# Patient Record
Sex: Female | Born: 1965 | Race: White | Hispanic: No | Marital: Married | Smoking: Never smoker
Health system: Southern US, Community
[De-identification: ages and names within clinical notes are randomized; demographics above are authoritative.]

## PROBLEM LIST (undated history)

## (undated) DIAGNOSIS — F32A Depression, unspecified: Secondary | ICD-10-CM

## (undated) DIAGNOSIS — F329 Major depressive disorder, single episode, unspecified: Secondary | ICD-10-CM

---

## 2000-04-20 ENCOUNTER — Encounter: Payer: Self-pay | Admitting: Family Medicine

## 2000-04-20 ENCOUNTER — Encounter: Admission: RE | Admit: 2000-04-20 | Discharge: 2000-04-20 | Payer: Self-pay | Admitting: Family Medicine

## 2000-07-09 ENCOUNTER — Other Ambulatory Visit: Admission: RE | Admit: 2000-07-09 | Discharge: 2000-07-09 | Payer: Self-pay | Admitting: Obstetrics and Gynecology

## 2000-11-23 ENCOUNTER — Other Ambulatory Visit: Admission: RE | Admit: 2000-11-23 | Discharge: 2000-11-23 | Payer: Self-pay | Admitting: Obstetrics and Gynecology

## 2001-03-20 ENCOUNTER — Encounter: Payer: Self-pay | Admitting: Obstetrics and Gynecology

## 2001-03-20 ENCOUNTER — Encounter: Admission: RE | Admit: 2001-03-20 | Discharge: 2001-03-20 | Payer: Self-pay | Admitting: Obstetrics and Gynecology

## 2001-06-28 ENCOUNTER — Other Ambulatory Visit: Admission: RE | Admit: 2001-06-28 | Discharge: 2001-06-28 | Payer: Self-pay | Admitting: Obstetrics and Gynecology

## 2002-02-14 ENCOUNTER — Other Ambulatory Visit: Admission: RE | Admit: 2002-02-14 | Discharge: 2002-02-14 | Payer: Self-pay | Admitting: Obstetrics and Gynecology

## 2002-05-09 ENCOUNTER — Ambulatory Visit (HOSPITAL_COMMUNITY): Admission: RE | Admit: 2002-05-09 | Discharge: 2002-05-09 | Payer: Self-pay | Admitting: Obstetrics and Gynecology

## 2002-10-28 ENCOUNTER — Other Ambulatory Visit: Admission: RE | Admit: 2002-10-28 | Discharge: 2002-10-28 | Payer: Self-pay | Admitting: Obstetrics and Gynecology

## 2006-03-01 ENCOUNTER — Encounter: Admission: RE | Admit: 2006-03-01 | Discharge: 2006-03-01 | Payer: Self-pay | Admitting: Obstetrics and Gynecology

## 2010-09-21 ENCOUNTER — Other Ambulatory Visit: Payer: Self-pay | Admitting: Obstetrics and Gynecology

## 2011-09-04 ENCOUNTER — Emergency Department (HOSPITAL_COMMUNITY)
Admission: EM | Admit: 2011-09-04 | Discharge: 2011-09-04 | Disposition: A | Discharge status: Home / Self Care | Payer: BC Managed Care – PPO | Attending: Emergency Medicine | Admitting: Emergency Medicine

## 2011-09-04 ENCOUNTER — Encounter (HOSPITAL_COMMUNITY): Payer: Self-pay | Admitting: Emergency Medicine

## 2011-09-04 DIAGNOSIS — E162 Hypoglycemia, unspecified: Secondary | ICD-10-CM

## 2011-09-04 DIAGNOSIS — F329 Major depressive disorder, single episode, unspecified: Secondary | ICD-10-CM | POA: Insufficient documentation

## 2011-09-04 DIAGNOSIS — E876 Hypokalemia: Secondary | ICD-10-CM | POA: Insufficient documentation

## 2011-09-04 DIAGNOSIS — R7309 Other abnormal glucose: Secondary | ICD-10-CM | POA: Insufficient documentation

## 2011-09-04 DIAGNOSIS — F10929 Alcohol use, unspecified with intoxication, unspecified: Secondary | ICD-10-CM

## 2011-09-04 DIAGNOSIS — G7119 Other specified myotonic disorders: Secondary | ICD-10-CM | POA: Insufficient documentation

## 2011-09-04 DIAGNOSIS — F1010 Alcohol abuse, uncomplicated: Secondary | ICD-10-CM | POA: Insufficient documentation

## 2011-09-04 DIAGNOSIS — F3289 Other specified depressive episodes: Secondary | ICD-10-CM | POA: Insufficient documentation

## 2011-09-04 HISTORY — DX: Depression, unspecified: F32.A

## 2011-09-04 HISTORY — DX: Major depressive disorder, single episode, unspecified: F32.9

## 2011-09-04 LAB — BASIC METABOLIC PANEL
Chloride: 104 mEq/L (ref 96–112)
Creatinine, Ser: 0.75 mg/dL (ref 0.50–1.10)
GFR calc Af Amer: >90 mL/min (ref >90)
GFR calc non Af Amer: >90 mL/min (ref >90)
Potassium: 3.1 mEq/L — ABNORMAL LOW (ref 3.5–5.1)

## 2011-09-04 LAB — RAPID URINE DRUG SCREEN, HOSP PERFORMED: Benzodiazepines: NOT DETECTED

## 2011-09-04 LAB — ETHANOL: Alcohol, Ethyl (B): 144 mg/dL — ABNORMAL HIGH (ref 0–11)

## 2011-09-04 LAB — GLUCOSE, CAPILLARY: Glucose-Capillary: 99 mg/dL (ref 70–99)

## 2011-09-04 MED ORDER — POTASSIUM CHLORIDE CRYS ER 20 MEQ PO TBCR
40.0000 meq | EXTENDED_RELEASE_TABLET | Freq: Once | ORAL | Status: AC
  Administered 2011-09-04: 40 meq via ORAL
  Filled 2011-09-04: qty 2

## 2011-09-04 NOTE — ED Provider Notes (Signed)
History     CSN: [id number]  Arrival date & time 09/04/11  0105   First MD Initiated Contact with Patient 09/04/11 [phone]      Chief Complaint  Patient presents with  . Alcohol Intoxication    (Consider location/radiation/quality/duration/timing/severity/associated sxs/prior treatment) HPI Comments: 46 year old female with a history of alcohol use this evening. According to paramedics they were called to the scene because the patient was found unresponsive. She was drinking a large amount of wine and had been smoking some marijuana which was a first time for her. According to her friends she slumped over, had decreased respiratory rate, turned blue and started frothing at the mouth. They deny any seizure activity. When the paramedics arrived they found her to be hypoglycemic with a blood sugar of 60, D50 was given and immediately she is became more responsive. According to the friends the patient has never had a response to alcoholic this in the past. She does take a Xanax but states her last use was 8 weeks ago, denies any other drug use.  Patient is a 46 y.o. female presenting with intoxication. The history is provided by the patient, a friend and the EMS personnel.  Alcohol Intoxication    Past Medical History  Diagnosis Date  . Depression     History reviewed. No pertinent past surgical history.  No family history on file.  History  Substance Use Topics  . Smoking status: Never Smoker   . Smokeless tobacco: Not on file  . Alcohol Use: Yes    OB History    Grav Para Term Preterm Abortions TAB SAB Ect Mult Living                  Review of Systems  All other systems reviewed and are negative.    Allergies  Review of patient's allergies indicates no known allergies.  Home Medications   Current Outpatient Rx  Name Route Sig Dispense Refill  . ALPRAZOLAM 0.5 MG PO TABS Oral Take 0.25-0.5 mg by mouth 3 (three) times daily as needed. For anxiety    . ESCITALOPRAM  OXALATE 20 MG PO TABS Oral Take 20 mg by mouth daily.    . ADULT MULTIVITAMIN W/MINERALS CH Oral Take 1 tablet by mouth daily.      BP 103/72  Pulse 69  Temp 97.6 F (36.4 C) (Oral)  Resp 18  SpO2 100%  Physical Exam  Nursing note and vitals reviewed. Constitutional: She appears well-developed and well-nourished. No distress.  HENT:  Head: Normocephalic and atraumatic.  Mouth/Throat: Oropharynx is clear and moist. No oropharyngeal exudate.  Eyes: Conjunctivae and EOM are normal. Pupils are equal, round, and reactive to light. Right eye exhibits no discharge. Left eye exhibits no discharge. No scleral icterus.  Neck: Normal range of motion. Neck supple. No JVD present. No thyromegaly present.  Cardiovascular: Normal rate, regular rhythm, normal heart sounds and intact distal pulses.  Exam reveals no gallop and no friction rub.   No murmur heard. Pulmonary/Chest: Effort normal and breath sounds normal. No respiratory distress. She has no wheezes. She has no rales.  Abdominal: Soft. Bowel sounds are normal. She exhibits no distension and no mass. There is no tenderness.  Musculoskeletal: Normal range of motion. She exhibits no edema and no tenderness.  Lymphadenopathy:    She has no cervical adenopathy.  Neurological: She is alert. Coordination normal.       Slurred speech.  Moves all extremities without difficulty, follows commands without difficulty, normal pupillary  exam, normal extraocular movements.  Skin: Skin is warm and dry. No rash noted. No erythema.  Psychiatric: She has a normal mood and affect. Her behavior is normal.    ED Course  Procedures (including critical care time)  Labs Reviewed  BASIC METABOLIC PANEL - Abnormal; Notable for the following:    Potassium 3.1 (*)     Glucose, Bld 133 (*)     All other components within normal limits  ETHANOL - Abnormal; Notable for the following:    Alcohol, Ethyl (B) 144 (*)     All other components within normal limits    URINE RAPID DRUG SCREEN (HOSP PERFORMED) - Abnormal; Notable for the following:    Tetrahydrocannabinol POSITIVE (*)     All other components within normal limits  GLUCOSE, CAPILLARY   No results found.   1. Alcohol intoxication   2. Hypokalemia   3. Hypoglycemia       MDM  Patient has occasional involuntary muscular myotonic contractions of the lower extremities, she has no focality to her neurologic exam and is able to follow commands. The patient denies any other substance use, check electrolytes, alcohol level, drug screen.  ED ECG REPORT  I personally interpreted this EKG   Date: 09/04/2011   Rate: 64  Rhythm: normal sinus rhythm  QRS Axis: normal  Intervals: normal  ST/T Wave abnormalities: normal  Conduction Disutrbances:none  Narrative Interpretation:   Old EKG Reviewed: none available  Labs show hypokalemia, ETOH level is 144.     CBG has maintained a normal range, will give PO challenge, has ambulated wtihout difficulty and the myoclonic jerking has stopped.  Normal MS since arrival, normal VS prior to d/c.  Potassium given as supplement.   Vida Roller, MD 09/04/11 [phone]

## 2011-09-04 NOTE — ED Notes (Signed)
Pt alert, arrives from friends house, EMS called for Unresponsive, pt found unresponsive by EMS, CBG 69,  IV est, #22 Right hand, became responsive after 25 G Dextrose IV, emesis pta

## 2011-09-04 NOTE — ED Notes (Signed)
[id number]<AV> Expected date:<BR> Expected time:<BR> Means of arrival:<BR> Comments:<BR> RM 11, ETOH, unresponsive, hypotensive

## 2011-09-04 NOTE — ED Notes (Signed)
EKG printed and given to EDP Hyacinth Meeker for review. No previous EKG available for comparison

## 2011-09-04 NOTE — ED Notes (Signed)
POCT CBG 99. RN Francena Hanly. notified

## 2013-06-05 ENCOUNTER — Encounter (HOSPITAL_BASED_OUTPATIENT_CLINIC_OR_DEPARTMENT_OTHER): Payer: Self-pay | Admitting: Emergency Medicine

## 2013-06-05 ENCOUNTER — Emergency Department (HOSPITAL_BASED_OUTPATIENT_CLINIC_OR_DEPARTMENT_OTHER)
Admission: EM | Admit: 2013-06-05 | Discharge: 2013-06-05 | Disposition: A | Discharge status: Home / Self Care | Payer: BC Managed Care – PPO | Attending: Emergency Medicine | Admitting: Emergency Medicine

## 2013-06-05 ENCOUNTER — Emergency Department (HOSPITAL_BASED_OUTPATIENT_CLINIC_OR_DEPARTMENT_OTHER): Payer: BC Managed Care – PPO

## 2013-06-05 DIAGNOSIS — Y9389 Activity, other specified: Secondary | ICD-10-CM | POA: Insufficient documentation

## 2013-06-05 DIAGNOSIS — W19XXXA Unspecified fall, initial encounter: Secondary | ICD-10-CM

## 2013-06-05 DIAGNOSIS — S81009A Unspecified open wound, unspecified knee, initial encounter: Secondary | ICD-10-CM | POA: Insufficient documentation

## 2013-06-05 DIAGNOSIS — S5000XA Contusion of unspecified elbow, initial encounter: Secondary | ICD-10-CM | POA: Insufficient documentation

## 2013-06-05 DIAGNOSIS — W138XXA Fall from, out of or through other building or structure, initial encounter: Secondary | ICD-10-CM | POA: Insufficient documentation

## 2013-06-05 DIAGNOSIS — T148XXA Other injury of unspecified body region, initial encounter: Secondary | ICD-10-CM

## 2013-06-05 DIAGNOSIS — F329 Major depressive disorder, single episode, unspecified: Secondary | ICD-10-CM | POA: Insufficient documentation

## 2013-06-05 DIAGNOSIS — Y92009 Unspecified place in unspecified non-institutional (private) residence as the place of occurrence of the external cause: Secondary | ICD-10-CM | POA: Insufficient documentation

## 2013-06-05 DIAGNOSIS — Z79899 Other long term (current) drug therapy: Secondary | ICD-10-CM | POA: Insufficient documentation

## 2013-06-05 DIAGNOSIS — S81809A Unspecified open wound, unspecified lower leg, initial encounter: Principal | ICD-10-CM

## 2013-06-05 DIAGNOSIS — F3289 Other specified depressive episodes: Secondary | ICD-10-CM | POA: Insufficient documentation

## 2013-06-05 DIAGNOSIS — S81819A Laceration without foreign body, unspecified lower leg, initial encounter: Secondary | ICD-10-CM

## 2013-06-05 DIAGNOSIS — S91009A Unspecified open wound, unspecified ankle, initial encounter: Principal | ICD-10-CM

## 2013-06-05 MED ORDER — OXYCODONE-ACETAMINOPHEN 5-325 MG PO TABS
1.0000 | ORAL_TABLET | Freq: Once | ORAL | Status: AC
  Administered 2013-06-05: 1 via ORAL
  Filled 2013-06-05: qty 1

## 2013-06-05 MED ORDER — OXYCODONE-ACETAMINOPHEN 5-325 MG PO TABS: 1.0000 | ORAL_TABLET | Freq: Four times a day (QID) | ORAL | Status: AC | PRN

## 2013-06-05 NOTE — ED Provider Notes (Signed)
CSN: [id number]     Arrival date & time 06/05/13  1948 History   First MD Initiated Contact with Patient 06/05/13 2020     Chief Complaint  Patient presents with  . Extremity Laceration     (Consider location/radiation/quality/duration/timing/severity/associated sxs/prior Treatment) HPI Comments: Pt states that she fell thru the sheet rock in the ceiling of her garage. Pt states that her garage door was up and she went thru the glass on the garage door. Pt didn't hit the ground. No loc. Pt states that she cut her right lower leg and that area is sore but she is able to ambulate without any problem. Tetanus is utd.   The history is provided by the patient. No language interpreter was used.    Past Medical History  Diagnosis Date  . Depression    Past Surgical History  Procedure Laterality Date  . Cesarean section     No family history on file. History  Substance Use Topics  . Smoking status: Never Smoker   . Smokeless tobacco: Not on file  . Alcohol Use: Yes     Comment: on weekends   OB History   Grav Para Term Preterm Abortions TAB SAB Ect Mult Living                 Review of Systems  Constitutional: Negative.   Respiratory: Negative.   Cardiovascular: Negative.       Allergies  Review of patient's allergies indicates no known allergies.  Home Medications   Prior to Admission medications   Medication Sig Start Date End Date Taking? Authorizing Provider  ALPRAZolam Prudy Feeler) 0.5 MG tablet Take 0.25-0.5 mg by mouth 3 (three) times daily as needed. For anxiety    Historical Provider, MD  escitalopram (LEXAPRO) 20 MG tablet Take 20 mg by mouth daily.    Historical Provider, MD  Multiple Vitamin (MULTIVITAMIN WITH MINERALS) TABS Take 1 tablet by mouth daily.    Historical Provider, MD   BP 124/62  Pulse 90  Temp(Src) 98.3 F (36.8 C) (Oral)  Resp 24  Ht 5\' 3"  (1.6 m)  Wt 139 lb (63.05 kg)  BMI 24.63 kg/m2  SpO2 100% Physical Exam  Nursing note and vitals  reviewed. Constitutional: She is oriented to person, place, and time. She appears well-developed and well-nourished.  HENT:  Head: Normocephalic and atraumatic.  Neck: Normal range of motion. Neck supple.  Cardiovascular: Normal rate and regular rhythm.   Pulmonary/Chest: Effort normal and breath sounds normal.  Abdominal: Soft. Bowel sounds are normal. There is no tenderness.  Musculoskeletal: Normal range of motion.       Cervical back: Normal.       Thoracic back: Normal.       Lumbar back: Normal.  Neurological: She is alert and oriented to person, place, and time. She exhibits normal muscle tone. Coordination normal.  Skin:  Bruising noted to the right elbow. Pt has full rom. Laceration to the right lower leg  Psychiatric: She has a normal mood and affect.    ED Course  LACERATION REPAIR Date/Time: 06/05/2013 10:01 PM Performed by: Teressa Lower Authorized by: Teressa Lower Consent: Verbal consent obtained. Risks and benefits: risks, benefits and alternatives were discussed Consent given by: patient Patient identity confirmed: verbally with patient Body area: lower extremity Location details: right lower leg Laceration length: 10 cm Foreign bodies: no foreign bodies Anesthesia: local infiltration Local anesthetic: lidocaine 1% with epinephrine Irrigation solution: tap water Irrigation method: syringe Skin closure: 3-0 Prolene  Number of sutures: 10 Technique: simple Approximation: close Approximation difficulty: simple Dressing: 4x4 sterile gauze Patient tolerance: Patient tolerated the procedure well with no immediate complications.  LACERATION REPAIR Date/Time: 06/05/2013 10:02 PM Performed by: Teressa LowerPICKERING, Elira Colasanti Authorized by: Teressa LowerPICKERING, Montgomery Rothlisberger Consent: Verbal consent obtained. Risks and benefits: risks, benefits and alternatives were discussed Consent given by: patient Patient identity confirmed: verbally with patient Body area: lower  extremity Location details: right lower leg Laceration length: 2 cm Foreign bodies: no foreign bodies Anesthesia: local infiltration Local anesthetic: lidocaine 1% with epinephrine Irrigation solution: tap water Irrigation method: tap Skin closure: 3-0 Prolene Number of sutures: 3 Technique: simple Approximation: close Approximation difficulty: simple Dressing: 4x4 sterile gauze Patient tolerance: Patient tolerated the procedure well with no immediate complications.   (including critical care time) Labs Review Labs Reviewed - No data to display  Imaging Review Dg Tibia/fibula Right  06/05/2013   CLINICAL DATA:  Fall through ceiling, laceration  EXAM: RIGHT TIBIA AND FIBULA - 2 VIEW  COMPARISON:  None.  FINDINGS: Soft tissue irregularity overlying the anterior mid to lower leg consistent with the clinical history of laceration. No retained radiopaque foreign body. No evidence of acute fracture or other osseous abnormality. Normal bony mineralization.  IMPRESSION: Soft tissue laceration without evidence of retained radiopaque foreign body or acute fracture.   Electronically Signed   By: Malachy MoanHeath  McCullough M.D.   On: 06/05/2013 21:24     EKG Interpretation None      MDM   Final diagnoses:  Contusion  Leg laceration  Fall    Wound closed without any problem. Pt is okay to follow up to have sutures removed with pcp. No loc with injury. Pt is neurologically intact. No fb noted on film. Will send home with pain medication    Teressa LowerVrinda Cassiel Fernandez, NP 06/05/13 2215

## 2013-06-05 NOTE — ED Notes (Signed)
Right leg lac cleansed and dressing applied in triage.

## 2013-06-05 NOTE — ED Notes (Signed)
Pt broke through sheet rock ceiling on garage atic, and leg went through garage door glass. Lac across right shin.

## 2013-06-05 NOTE — ED Notes (Signed)
NP at bedside suturing.  

## 2013-06-05 NOTE — Discharge Instructions (Signed)
Contusion A contusion is a deep bruise. Contusions happen when an injury causes bleeding under the skin. Signs of bruising include pain, puffiness (swelling), and discolored skin. The contusion may turn blue, purple, or yellow. HOME CARE   Put ice on the injured area.  Put ice in a plastic bag.  Place a towel between your skin and the bag.  Leave the ice on for 15-20 minutes, 03-04 times a day.  Only take medicine as told by your doctor.  Rest the injured area.  If possible, raise (elevate) the injured area to lessen puffiness. GET HELP RIGHT AWAY IF:   You have more bruising or puffiness.  You have pain that is getting worse.  Your puffiness or pain is not helped by medicine. MAKE SURE YOU:   Understand these instructions.  Will watch your condition.  Will get help right away if you are not doing well or get worse. Document Released: 06/14/2007 Document Revised: 03/20/2011 Document Reviewed: 10/31/2010 [facility] Patient Information 2014 [place], [place].  Laceration Care, Adult A laceration is a cut or lesion that goes through all layers of the skin and into the tissue just beneath the skin. TREATMENT  Some lacerations may not require closure. Some lacerations may not be able to be closed due to an increased risk of infection. It is important to see your caregiver as soon as possible after an injury to minimize the risk of infection and maximize the opportunity for successful closure. If closure is appropriate, pain medicines may be given, if needed. The wound will be cleaned to help prevent infection. Your caregiver will use stitches (sutures), staples, wound glue (adhesive), or skin adhesive strips to repair the laceration. These tools bring the skin edges together to allow for faster healing and a better cosmetic outcome. However, all wounds will heal with a scar. Once the wound has healed, scarring can be minimized by covering the wound with sunscreen during the day for 1  full year. HOME CARE INSTRUCTIONS  For sutures or staples:  Keep the wound clean and dry.  If you were given a bandage (dressing), you should change it at least once a day. Also, change the dressing if it becomes wet or dirty, or as directed by your caregiver.  Wash the wound with soap and water 2 times a day. Rinse the wound off with water to remove all soap. Pat the wound dry with a clean towel.  After cleaning, apply a thin layer of the antibiotic ointment as recommended by your caregiver. This will help prevent infection and keep the dressing from sticking.  You may shower as usual after the first 24 hours. Do not soak the wound in water until the sutures are removed.  Only take over-the-counter or prescription medicines for pain, discomfort, or fever as directed by your caregiver.  Get your sutures or staples removed as directed by your caregiver. For skin adhesive strips:  Keep the wound clean and dry.  Do not get the skin adhesive strips wet. You may bathe carefully, using caution to keep the wound dry.  If the wound gets wet, pat it dry with a clean towel.  Skin adhesive strips will fall off on their own. You may trim the strips as the wound heals. Do not remove skin adhesive strips that are still stuck to the wound. They will fall off in time. For wound adhesive:  You may briefly wet your wound in the shower or bath. Do not soak or scrub the wound. Do not  swim. Avoid periods of heavy perspiration until the skin adhesive has fallen off on its own. After showering or bathing, gently pat the wound dry with a clean towel.  Do not apply liquid medicine, cream medicine, or ointment medicine to your wound while the skin adhesive is in place. This may loosen the film before your wound is healed.  If a dressing is placed over the wound, be careful not to apply tape directly over the skin adhesive. This may cause the adhesive to be pulled off before the wound is healed.  Avoid  prolonged exposure to sunlight or tanning lamps while the skin adhesive is in place. Exposure to ultraviolet light in the first year will darken the scar.  The skin adhesive will usually remain in place for 5 to 10 days, then naturally fall off the skin. Do not pick at the adhesive film. You may need a tetanus shot if:  You cannot remember when you had your last tetanus shot.  You have never had a tetanus shot. If you get a tetanus shot, your arm may swell, get red, and feel warm to the touch. This is common and not a problem. If you need a tetanus shot and you choose not to have one, there is a rare chance of getting tetanus. Sickness from tetanus can be serious. SEEK MEDICAL CARE IF:   You have redness, swelling, or increasing pain in the wound.  You see a red line that goes away from the wound.  You have yellowish-white fluid (pus) coming from the wound.  You have a fever.  You notice a bad smell coming from the wound or dressing.  Your wound breaks open before or after sutures have been removed.  You notice something coming out of the wound such as wood or glass.  Your wound is on your hand or foot and you cannot move a finger or toe. SEEK IMMEDIATE MEDICAL CARE IF:   Your pain is not controlled with prescribed medicine.  You have severe swelling around the wound causing pain and numbness or a change in color in your arm, hand, leg, or foot.  Your wound splits open and starts bleeding.  You have worsening numbness, weakness, or loss of function of any joint around or beyond the wound.  You develop painful lumps near the wound or on the skin anywhere on your body. MAKE SURE YOU:   Understand these instructions.  Will watch your condition.  Will get help right away if you are not doing well or get worse. Document Released: 12/26/2004 Document Revised: 03/20/2011 Document Reviewed: 06/21/2010 [facility] Patient Information 2014 [place]

## 2013-06-06 NOTE — ED Provider Notes (Signed)
Medical screening examination/treatment/procedure(s) were performed by non-physician practitioner and as supervising physician I was immediately available for consultation/collaboration.  Hernan Turnage E Jasper Ruminski, MD 06/06/13 0930 

## 2017-03-15 ENCOUNTER — Other Ambulatory Visit: Payer: Self-pay | Admitting: Family Medicine

## 2017-03-15 DIAGNOSIS — G43909 Migraine, unspecified, not intractable, without status migrainosus: Secondary | ICD-10-CM | POA: Diagnosis not present

## 2017-03-15 DIAGNOSIS — Z0000 Encounter for general adult medical examination without abnormal findings: Secondary | ICD-10-CM | POA: Diagnosis not present

## 2017-03-15 DIAGNOSIS — Z1231 Encounter for screening mammogram for malignant neoplasm of breast: Secondary | ICD-10-CM

## 2017-03-15 DIAGNOSIS — R5383 Other fatigue: Secondary | ICD-10-CM | POA: Diagnosis not present

## 2017-03-15 DIAGNOSIS — E7800 Pure hypercholesterolemia, unspecified: Secondary | ICD-10-CM | POA: Diagnosis not present

## 2017-03-21 DIAGNOSIS — H6981 Other specified disorders of Eustachian tube, right ear: Secondary | ICD-10-CM | POA: Diagnosis not present

## 2017-04-05 ENCOUNTER — Ambulatory Visit: Payer: Self-pay

## 2017-05-11 DIAGNOSIS — E559 Vitamin D deficiency, unspecified: Secondary | ICD-10-CM | POA: Diagnosis not present

## 2017-07-01 DIAGNOSIS — N390 Urinary tract infection, site not specified: Secondary | ICD-10-CM | POA: Diagnosis not present

## 2017-07-01 DIAGNOSIS — R300 Dysuria: Secondary | ICD-10-CM | POA: Diagnosis not present

## 2017-07-01 DIAGNOSIS — N3941 Urge incontinence: Secondary | ICD-10-CM | POA: Diagnosis not present

## 2017-07-24 DIAGNOSIS — Z01419 Encounter for gynecological examination (general) (routine) without abnormal findings: Secondary | ICD-10-CM | POA: Diagnosis not present

## 2017-07-24 DIAGNOSIS — N390 Urinary tract infection, site not specified: Secondary | ICD-10-CM | POA: Diagnosis not present

## 2017-07-31 DIAGNOSIS — N390 Urinary tract infection, site not specified: Secondary | ICD-10-CM | POA: Diagnosis not present

## 2017-09-17 DIAGNOSIS — E7800 Pure hypercholesterolemia, unspecified: Secondary | ICD-10-CM | POA: Diagnosis not present

## 2018-04-08 DIAGNOSIS — E7800 Pure hypercholesterolemia, unspecified: Secondary | ICD-10-CM | POA: Diagnosis not present

## 2018-04-08 DIAGNOSIS — M2550 Pain in unspecified joint: Secondary | ICD-10-CM | POA: Diagnosis not present

## 2018-04-08 DIAGNOSIS — Z0000 Encounter for general adult medical examination without abnormal findings: Secondary | ICD-10-CM | POA: Diagnosis not present

## 2018-09-26 ENCOUNTER — Other Ambulatory Visit: Payer: Self-pay | Admitting: Family Medicine

## 2018-09-26 DIAGNOSIS — Z1231 Encounter for screening mammogram for malignant neoplasm of breast: Secondary | ICD-10-CM

## 2018-10-10 DIAGNOSIS — E7800 Pure hypercholesterolemia, unspecified: Secondary | ICD-10-CM | POA: Diagnosis not present

## 2019-04-16 DIAGNOSIS — R69 Illness, unspecified: Secondary | ICD-10-CM | POA: Diagnosis not present

## 2019-04-16 DIAGNOSIS — B001 Herpesviral vesicular dermatitis: Secondary | ICD-10-CM | POA: Diagnosis not present

## 2019-04-16 DIAGNOSIS — Z0000 Encounter for general adult medical examination without abnormal findings: Secondary | ICD-10-CM | POA: Diagnosis not present

## 2019-04-16 DIAGNOSIS — N952 Postmenopausal atrophic vaginitis: Secondary | ICD-10-CM | POA: Diagnosis not present

## 2019-04-16 DIAGNOSIS — J301 Allergic rhinitis due to pollen: Secondary | ICD-10-CM | POA: Diagnosis not present

## 2019-04-16 DIAGNOSIS — M2550 Pain in unspecified joint: Secondary | ICD-10-CM | POA: Diagnosis not present

## 2019-04-16 DIAGNOSIS — E7800 Pure hypercholesterolemia, unspecified: Secondary | ICD-10-CM | POA: Diagnosis not present

## 2019-04-16 DIAGNOSIS — G43909 Migraine, unspecified, not intractable, without status migrainosus: Secondary | ICD-10-CM | POA: Diagnosis not present

## 2019-09-02 DIAGNOSIS — Z1159 Encounter for screening for other viral diseases: Secondary | ICD-10-CM | POA: Diagnosis not present

## 2019-09-05 DIAGNOSIS — Z1211 Encounter for screening for malignant neoplasm of colon: Secondary | ICD-10-CM | POA: Diagnosis not present

## 2019-09-05 DIAGNOSIS — K5730 Diverticulosis of large intestine without perforation or abscess without bleeding: Secondary | ICD-10-CM | POA: Diagnosis not present

## 2019-10-21 DIAGNOSIS — R197 Diarrhea, unspecified: Secondary | ICD-10-CM | POA: Diagnosis not present

## 2019-10-21 DIAGNOSIS — E7800 Pure hypercholesterolemia, unspecified: Secondary | ICD-10-CM | POA: Diagnosis not present

## 2020-05-12 ENCOUNTER — Other Ambulatory Visit: Payer: Self-pay | Admitting: Family Medicine

## 2020-05-12 ENCOUNTER — Ambulatory Visit
Admission: RE | Admit: 2020-05-12 | Discharge: 2020-05-12 | Disposition: A | Discharge status: Home / Self Care | Payer: No Typology Code available for payment source | Source: Ambulatory Visit | Attending: Family Medicine | Admitting: Family Medicine

## 2020-05-12 DIAGNOSIS — R29898 Other symptoms and signs involving the musculoskeletal system: Secondary | ICD-10-CM

## 2020-05-12 DIAGNOSIS — Z1231 Encounter for screening mammogram for malignant neoplasm of breast: Secondary | ICD-10-CM

## 2020-05-20 ENCOUNTER — Other Ambulatory Visit: Payer: Self-pay | Admitting: Family Medicine

## 2020-05-20 DIAGNOSIS — R270 Ataxia, unspecified: Secondary | ICD-10-CM

## 2020-06-18 ENCOUNTER — Other Ambulatory Visit: Payer: No Typology Code available for payment source

## 2020-06-25 ENCOUNTER — Other Ambulatory Visit: Payer: No Typology Code available for payment source

## 2020-07-16 ENCOUNTER — Other Ambulatory Visit: Payer: Self-pay | Admitting: Family Medicine

## 2020-07-16 ENCOUNTER — Ambulatory Visit: Payer: No Typology Code available for payment source

## 2020-07-16 DIAGNOSIS — N939 Abnormal uterine and vaginal bleeding, unspecified: Secondary | ICD-10-CM

## 2020-07-28 ENCOUNTER — Other Ambulatory Visit: Payer: Self-pay

## 2020-07-28 ENCOUNTER — Ambulatory Visit
Admission: RE | Admit: 2020-07-28 | Discharge: 2020-07-28 | Disposition: A | Discharge status: Home / Self Care | Payer: Self-pay | Source: Ambulatory Visit | Attending: Family Medicine | Admitting: Family Medicine

## 2020-07-28 DIAGNOSIS — N939 Abnormal uterine and vaginal bleeding, unspecified: Secondary | ICD-10-CM

## 2021-12-04 IMAGING — US US PELVIS COMPLETE WITH TRANSVAGINAL
1 series · 13 of 25 positions shown · non-contrast
Comparison: None

CLINICAL DATA: Abnormal vaginal bleeding, postmenopausal bleeding,
uses estrogen patch,



[Series 1: us pelvis complete with transvaginal · 0.25mm/px · 13 of 43 slices shown]
[im 1/43]
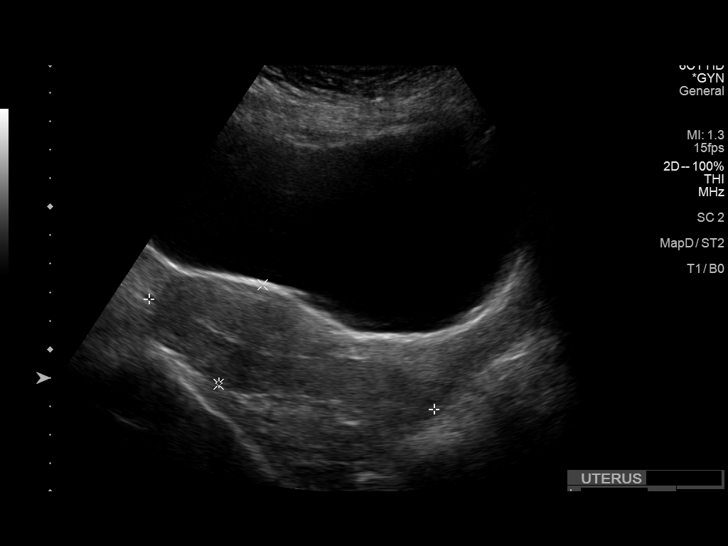
[im 4/43]
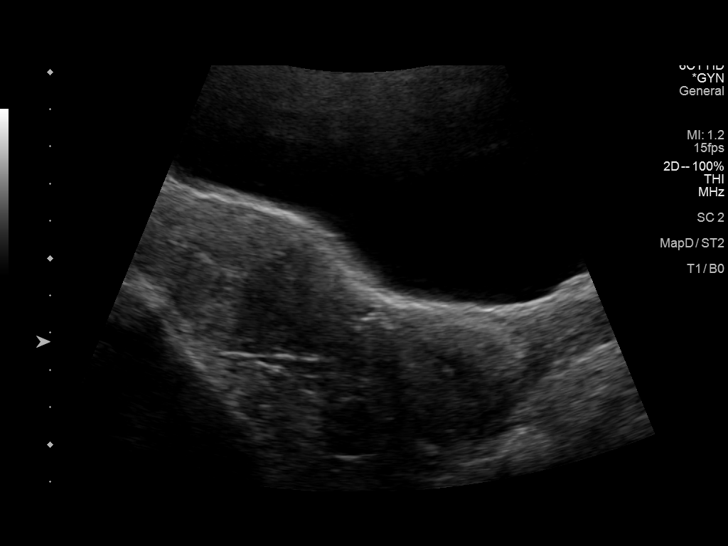
[im 8/43]
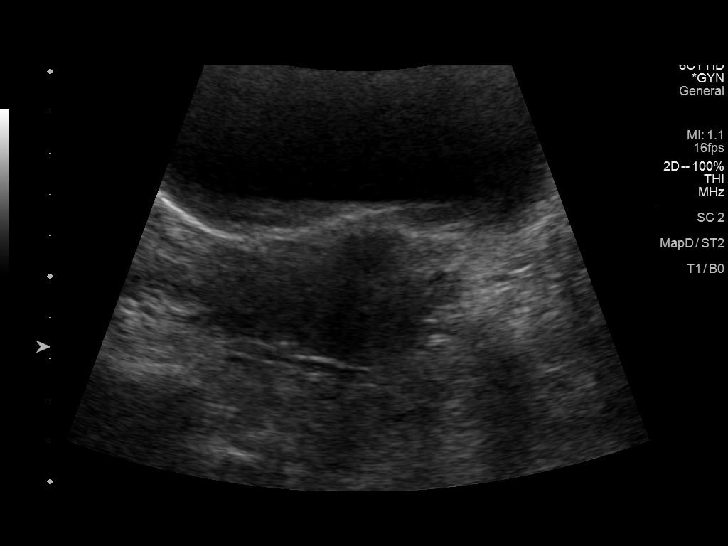
[im 11/43]
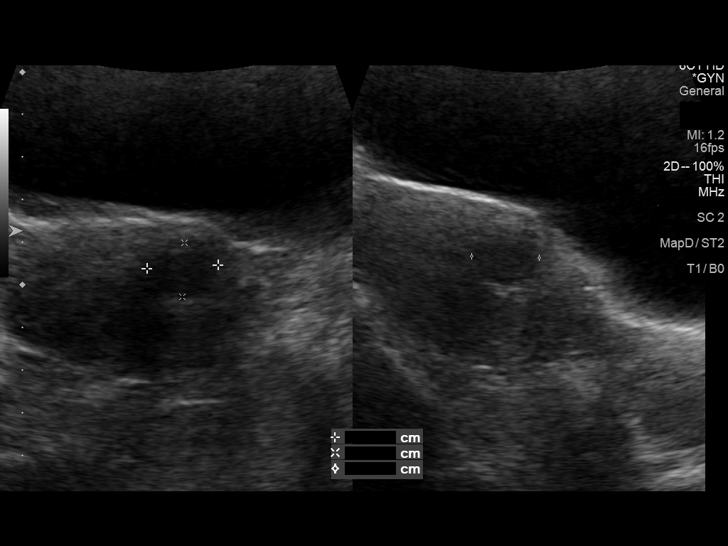
[im 15/43]
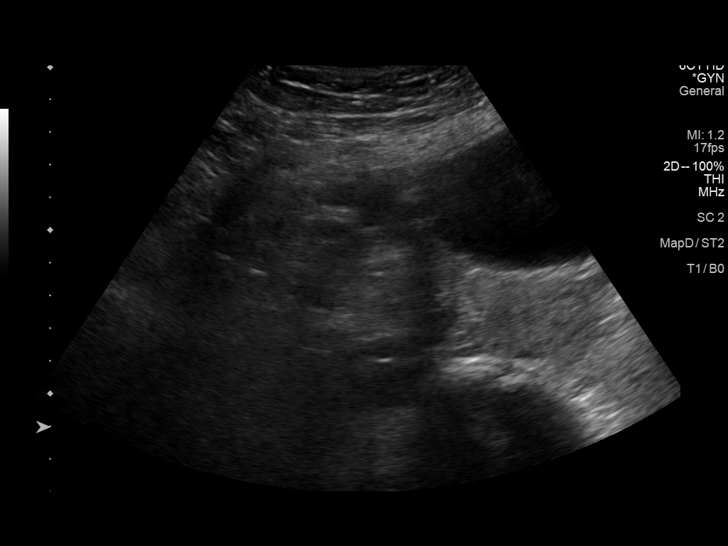
[im 18/43]
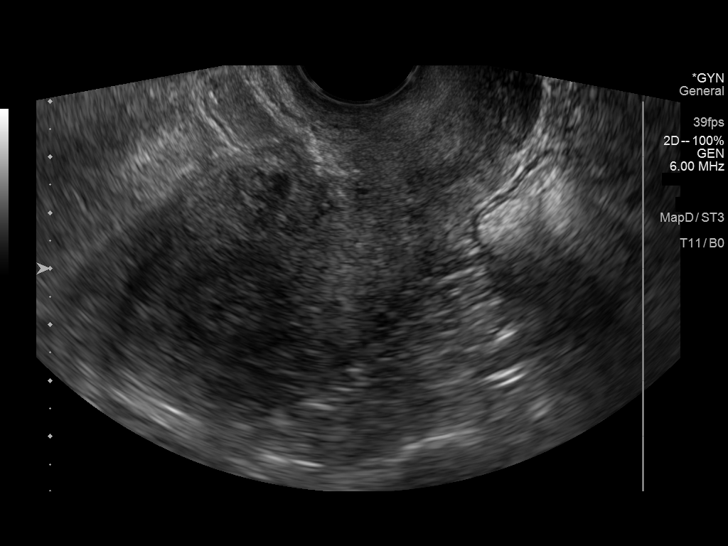
[im 22/43]
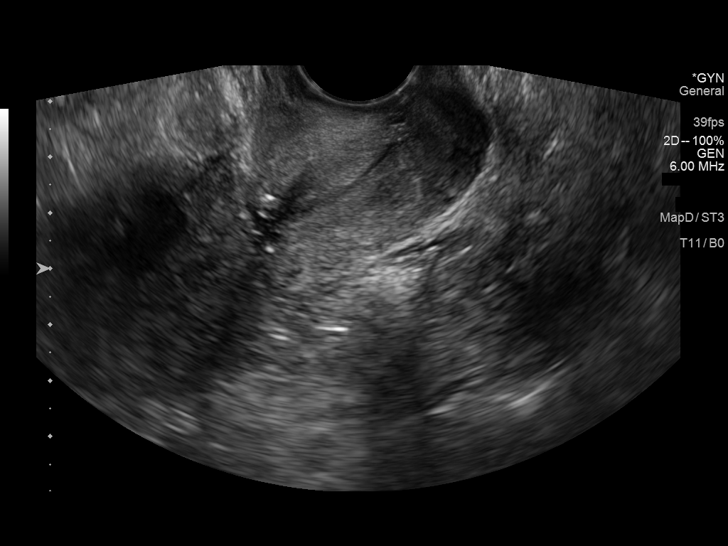
[im 25/43]
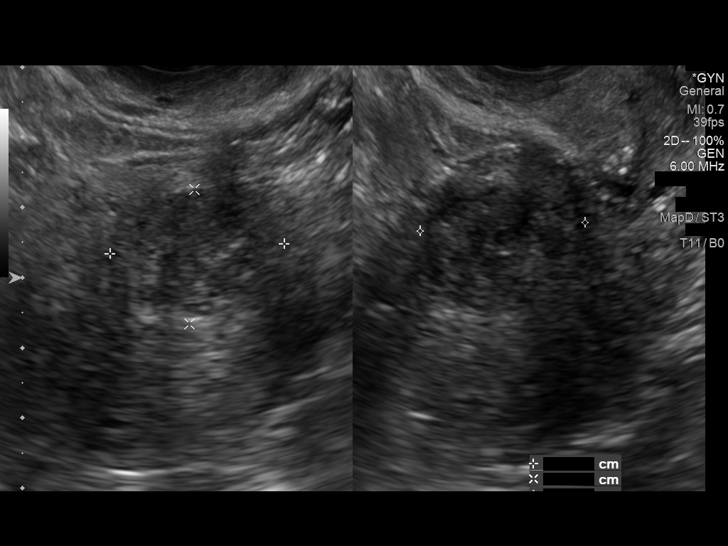
[im 29/43]
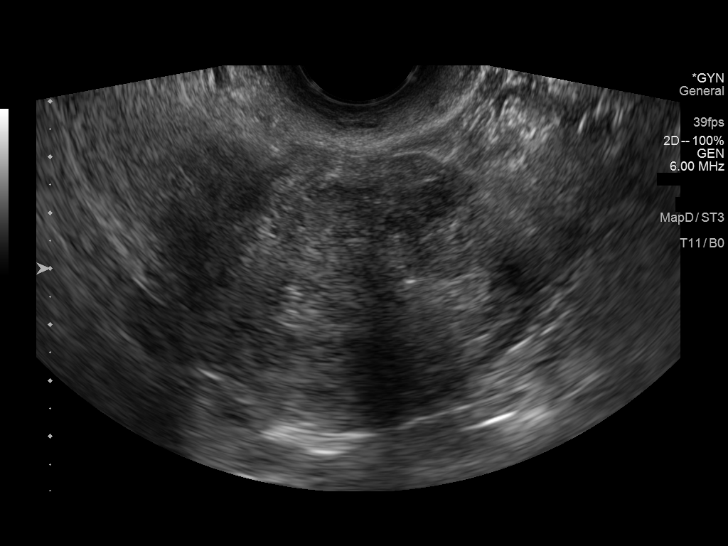
[im 32/43]
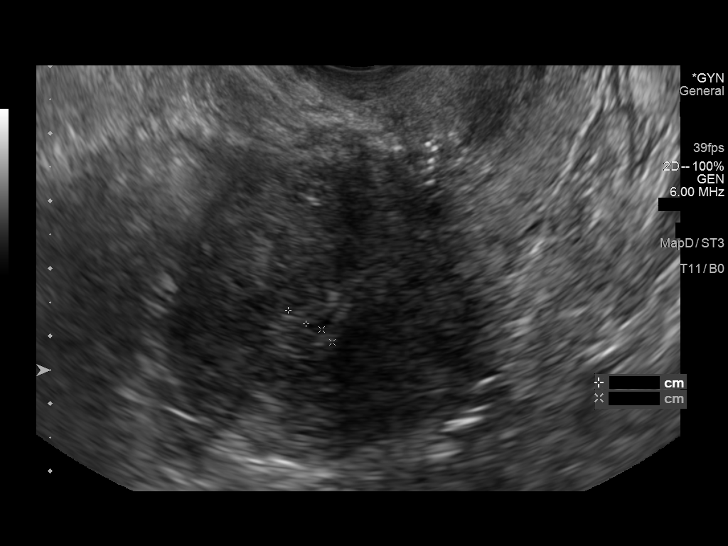
[im 36/43]
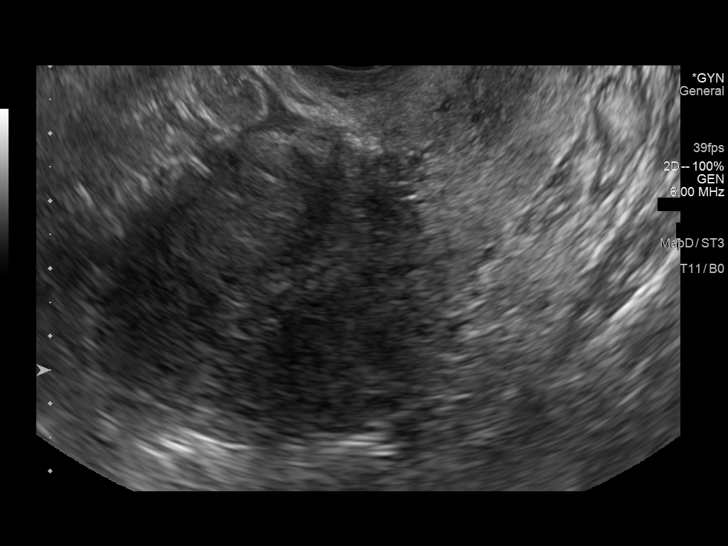
[im 39/43]
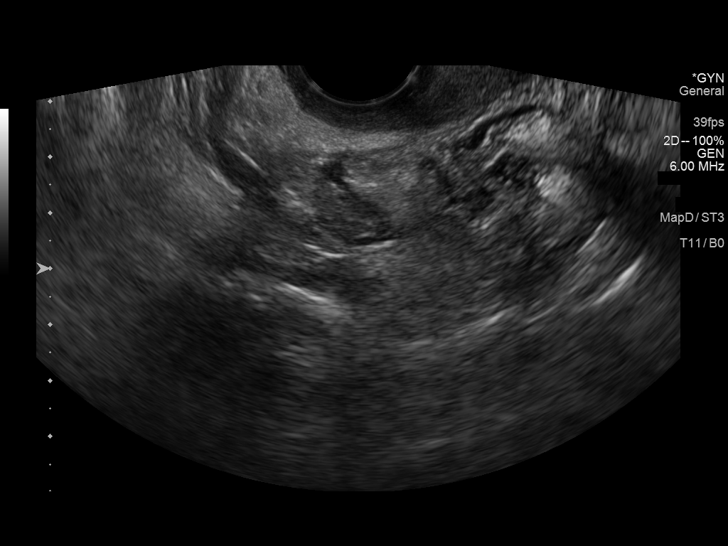
[im 43/43]
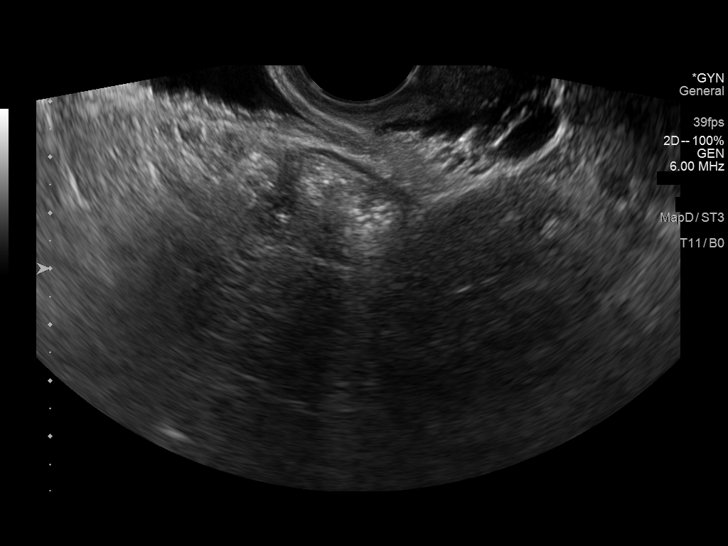

[13 of 25 positions shown; findings below may reference images not displayed]

FINDINGS: Uterus

Measurements: 10.7 x 4.0 x 5.8 cm = volume: 124 mL. Anteverted.
Anterior wall Caesarean section scar likely present. Nabothian cyst
at cervix. Anterior wall subserosal leiomyoma at upper uterus 2.5 x
1.9 x 2.4 cm. Heterogeneous myometrium without additional mass.

Endometrium

Thickness: 6 mm. Mildly heterogeneous. Small amount of nonspecific
endometrial fluid.

Right ovary

Not visualized, likely obscured by bowel

Left ovary

Not visualized, likely obscured by bowel

Other findings

No free pelvic fluid.  No adnexal masses.
IMPRESSION: 2.5 cm diameter leiomyoma subserosal at anterior upper uterus.

6 mm endometrial complex, abnormal for a postmenopausal patient with
bleeding; In the setting of post-menopausal bleeding, endometrial
sampling is indicated to exclude carcinoma. If results are benign,
sonohysterogram should be considered for focal lesion work-up. (Ref:
Radiological Reasoning: Algorithmic Workup of Abnormal Vaginal
Bleeding with Endovaginal Sonography and Sonohysterography. AJR
[id number]; 191:[id number])
